# Patient Record
Sex: Male | Born: 2000 | Race: Black or African American | Hispanic: No | Marital: Single | Smoking: Never smoker
Health system: Southern US, Community
[De-identification: ages and names within clinical notes are randomized; demographics above are authoritative.]

## PROBLEM LIST (undated history)

## (undated) DIAGNOSIS — J45909 Unspecified asthma, uncomplicated: Secondary | ICD-10-CM

## (undated) DIAGNOSIS — Z87828 Personal history of other (healed) physical injury and trauma: Secondary | ICD-10-CM

## (undated) DIAGNOSIS — Z9109 Other allergy status, other than to drugs and biological substances: Secondary | ICD-10-CM

## (undated) HISTORY — PX: ADENOIDECTOMY: SUR15

---
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
## 2020-03-13 ENCOUNTER — Ambulatory Visit (INDEPENDENT_AMBULATORY_CARE_PROVIDER_SITE_OTHER): Payer: Medicaid Other

## 2020-03-13 ENCOUNTER — Encounter (HOSPITAL_COMMUNITY): Payer: Self-pay | Admitting: Emergency Medicine

## 2020-03-13 ENCOUNTER — Ambulatory Visit (HOSPITAL_COMMUNITY)
Admission: EM | Admit: 2020-03-13 | Discharge: 2020-03-13 | Disposition: A | Discharge status: Home / Self Care | Payer: Medicaid Other | Attending: Urgent Care | Admitting: Urgent Care

## 2020-03-13 DIAGNOSIS — R10819 Abdominal tenderness, unspecified site: Secondary | ICD-10-CM

## 2020-03-13 DIAGNOSIS — R079 Chest pain, unspecified: Secondary | ICD-10-CM | POA: Diagnosis not present

## 2020-03-13 DIAGNOSIS — R0789 Other chest pain: Secondary | ICD-10-CM

## 2020-03-13 MED ORDER — TIZANIDINE HCL 4 MG PO TABS: 4.0000 mg | ORAL_TABLET | Freq: Three times a day (TID) | ORAL | 0 refills | Status: DC | PRN

## 2020-03-13 MED ORDER — NAPROXEN 500 MG PO TABS: 500.0000 mg | ORAL_TABLET | Freq: Two times a day (BID) | ORAL | 0 refills | Status: DC

## 2020-03-13 NOTE — Discharge Instructions (Addendum)
I will call with your report tomorrow. In the meantime, start naproxen for pain and inflammation, tizanidine for muscle relaxant. Take it easy for the next 1-2 weeks with sports and strenuous physical activities.
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
## 2020-04-29 ENCOUNTER — Emergency Department (HOSPITAL_COMMUNITY)
Admission: EM | Admit: 2020-04-29 | Discharge: 2020-04-30 | Disposition: A | Discharge status: Home / Self Care | Payer: Medicaid Other | Attending: Emergency Medicine | Admitting: Emergency Medicine

## 2020-04-29 ENCOUNTER — Encounter (HOSPITAL_COMMUNITY): Payer: Self-pay | Admitting: Emergency Medicine

## 2020-04-29 ENCOUNTER — Emergency Department (HOSPITAL_COMMUNITY): Payer: Medicaid Other

## 2020-04-29 ENCOUNTER — Other Ambulatory Visit: Payer: Self-pay

## 2020-04-29 DIAGNOSIS — J45909 Unspecified asthma, uncomplicated: Secondary | ICD-10-CM | POA: Insufficient documentation

## 2020-04-29 DIAGNOSIS — F419 Anxiety disorder, unspecified: Secondary | ICD-10-CM | POA: Insufficient documentation

## 2020-04-29 DIAGNOSIS — Z046 Encounter for general psychiatric examination, requested by authority: Secondary | ICD-10-CM | POA: Diagnosis present

## 2020-04-29 DIAGNOSIS — F332 Major depressive disorder, recurrent severe without psychotic features: Secondary | ICD-10-CM | POA: Insufficient documentation

## 2020-04-29 DIAGNOSIS — U071 COVID-19: Secondary | ICD-10-CM | POA: Diagnosis not present

## 2020-04-29 DIAGNOSIS — R45851 Suicidal ideations: Secondary | ICD-10-CM | POA: Insufficient documentation

## 2020-04-29 LAB — CBC WITH DIFFERENTIAL/PLATELET
Abs Immature Granulocytes: 0.01 10*3/uL (ref 0.00–0.07)
Basophils Absolute: 0 10*3/uL (ref 0.0–0.1)
Basophils Relative: 1 %
Eosinophils Absolute: 0.1 10*3/uL (ref 0.0–0.5)
Eosinophils Relative: 4 %
HCT: 46.5 % (ref 39.0–52.0)
Hemoglobin: 15.6 g/dL (ref 13.0–17.0)
Immature Granulocytes: 0 %
Lymphocytes Relative: 33 %
Lymphs Abs: 1.2 10*3/uL (ref 0.7–4.0)
MCH: 32.5 pg (ref 26.0–34.0)
MCHC: 33.5 g/dL (ref 30.0–36.0)
MCV: 96.9 fL (ref 80.0–100.0)
Monocytes Absolute: 0.4 10*3/uL (ref 0.1–1.0)
Monocytes Relative: 12 %
Neutro Abs: 1.8 10*3/uL (ref 1.7–7.7)
Neutrophils Relative %: 50 %
Platelets: 274 10*3/uL (ref 150–400)
RBC: 4.8 MIL/uL (ref 4.22–5.81)
RDW: 11.3 % — ABNORMAL LOW (ref 11.5–15.5)
WBC: 3.6 10*3/uL — ABNORMAL LOW (ref 4.0–10.5)
nRBC: 0 % (ref 0.0–0.2)

## 2020-04-29 LAB — BASIC METABOLIC PANEL
Anion gap: 11 (ref 5–15)
BUN: 12 mg/dL (ref 6–20)
CO2: 27 mmol/L (ref 22–32)
Calcium: 9.5 mg/dL (ref 8.9–10.3)
Chloride: 103 mmol/L (ref 98–111)
Creatinine, Ser: 0.98 mg/dL (ref 0.61–1.24)
GFR, Estimated: >60 mL/min (ref >60)
Glucose, Bld: 92 mg/dL (ref 70–99)
Potassium: 3.4 mmol/L — ABNORMAL LOW (ref 3.5–5.1)
Sodium: 141 mmol/L (ref 135–145)

## 2020-04-29 NOTE — ED Triage Notes (Signed)
Patient reports intermittent SOB worse when lying down , fatigue ,palpitations, panick attack and lightheaded onset last week . No cough or fever .

## 2020-04-30 LAB — HEPATIC FUNCTION PANEL
ALT: 22 U/L (ref 0–44)
AST: 19 U/L (ref 15–41)
Albumin: 4.1 g/dL (ref 3.5–5.0)
Alkaline Phosphatase: 44 U/L (ref 38–126)
Bilirubin, Direct: 0.2 mg/dL (ref 0.0–0.2)
Indirect Bilirubin: 1 mg/dL — ABNORMAL HIGH (ref 0.3–0.9)
Total Bilirubin: 1.2 mg/dL (ref 0.3–1.2)
Total Protein: 7.1 g/dL (ref 6.5–8.1)

## 2020-04-30 LAB — RESP PANEL BY RT-PCR (FLU A&B, COVID) ARPGX2
Influenza A by PCR: NEGATIVE
Influenza B by PCR: NEGATIVE
SARS Coronavirus 2 by RT PCR: POSITIVE — AB

## 2020-04-30 LAB — RAPID URINE DRUG SCREEN, HOSP PERFORMED
Amphetamines: NOT DETECTED
Barbiturates: NOT DETECTED
Benzodiazepines: NOT DETECTED
Cocaine: NOT DETECTED
Opiates: NOT DETECTED
Tetrahydrocannabinol: NOT DETECTED

## 2020-04-30 LAB — SALICYLATE LEVEL: Salicylate Lvl: <7 mg/dL — ABNORMAL LOW (ref 7.0–30.0)

## 2020-04-30 LAB — ETHANOL: Alcohol, Ethyl (B): <10 mg/dL (ref <10)

## 2020-04-30 LAB — ACETAMINOPHEN LEVEL: Acetaminophen (Tylenol), Serum: <10 ug/mL — ABNORMAL LOW (ref 10–30)

## 2020-04-30 MED ORDER — ALBUTEROL SULFATE HFA 108 (90 BASE) MCG/ACT IN AERS: 2.0000 | INHALATION_SPRAY | Freq: Once | RESPIRATORY_TRACT | Status: DC
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
## 2020-04-30 NOTE — ED Provider Notes (Addendum)
[facility]   CSN: [id number] Arrival date & time: 04/29/20  2149     History Chief Complaint  Patient presents with  . Suicidal    Marylou FlesherJeremiah J Shahin is a 20 y.o. male history of asthma otherwise healthy no daily medication use.  Patient presented last night for shortness of breath that he relates to his anxiety and depression.  He reports that he has been under increasing stress over the past few days he was kicked out of his mother's house has been staying with his uncle and reports that he has not had a lot to eat or drink because of lack of food there.  He also reports that his car broke down.  He reports that he has been having thoughts of harming himself but he does not have a clear plan he reports that he has been feeling depressed for quite a while and he would like to talk to someone about this.  He reports that these feelings got worse last night which led to his feeling of being short of breath which led him to come to this emergency department for evaluation, due to extended wait time in the lobby over 10 hours his symptoms of shortness of breath and anxiety have improved.  Denies recent illness, fever/chills, fall/injury, chest pain, cough/hemoptysis, abdominal pain, vomiting, diarrhea, extremity swelling/color change, homicidal ideations, visual/auditory hallucinations, drug/alcohol use, attempts and self-harm, toxic ingestion or any additional concerns.  HPI     Past Medical History:  Diagnosis Date  . Asthma   . Environmental allergies   . History of sprained ankle     There are no problems to display for this patient.   Past Surgical History:  Procedure Laterality Date  . ADENOIDECTOMY         No family history on file.  Social History   Tobacco Use  . Smoking status: Never Smoker  . Smokeless tobacco: Never Used  Substance Use Topics  . Alcohol use: No  . Drug use: No    Home Medications Prior  to Admission medications   Medication Sig Start Date End Date Taking? Authorizing Provider  acetaminophen (TYLENOL) 325 MG tablet Take 2 tablets (650 mg total) by mouth every 6 (six) hours as needed for mild pain or moderate pain. 11/30/17   Sherrilee GillesScoville, Brittany N, NP  amoxicillin (AMOXIL) 500 MG capsule Take 1 capsule (500 mg total) by mouth 3 (three) times daily. 08/05/14   Defelice, Para MarchJeanette, NP  HYDROcodone-acetaminophen (NORCO/VICODIN) 5-325 MG per tablet Take 1 tablet by mouth every 4 (four) hours as needed. 12/18/13   Niel HummerKuhner, Ross, MD  ibuprofen (ADVIL,MOTRIN) 600 MG tablet Take 1 tablet (600 mg total) by mouth every 6 (six) hours as needed for mild pain or moderate pain. 11/30/17   Sherrilee GillesScoville, Brittany N, NP  naproxen (NAPROSYN) 500 MG tablet Take 1 tablet (500 mg total) by mouth 2 (two) times daily with a meal. 03/13/20   Wallis BambergMani, Mario, PA-C  tiZANidine (ZANAFLEX) 4 MG tablet Take 1 tablet (4 mg total) by mouth every 8 (eight) hours as needed. 03/13/20   Wallis BambergMani, Mario, PA-C    Allergies    Patient has no known allergies.  Review of Systems   Review of Systems Ten systems are reviewed and are negative for acute change except as noted in the HPI  Physical Exam Updated Vital Signs BP 121/78   Pulse 69   Temp 98.2 F (36.8 C)   Resp 16  Ht 5\' 8"  (1.727 m)   Wt 64 kg   SpO2 100%   BMI 21.45 kg/m   Physical Exam Constitutional:      General: He is not in acute distress.    Appearance: Normal appearance. He is well-developed. He is not ill-appearing or diaphoretic.  HENT:     Head: Normocephalic and atraumatic.  Eyes:     General: Vision grossly intact. Gaze aligned appropriately.     Pupils: Pupils are equal, round, and reactive to light.  Neck:     Trachea: Trachea and phonation normal.  Cardiovascular:     Rate and Rhythm: Normal rate and regular rhythm.     Pulses: Normal pulses.     Heart sounds: Normal heart sounds.  Pulmonary:     Effort: Pulmonary effort is normal. No  respiratory distress.     Breath sounds: Normal breath sounds.  Abdominal:     General: There is no distension.     Palpations: Abdomen is soft.     Tenderness: There is no abdominal tenderness. There is no guarding or rebound.  Musculoskeletal:        General: Normal range of motion.     Cervical back: Normal range of motion.  Skin:    General: Skin is warm and dry.  Neurological:     Mental Status: He is alert.     GCS: GCS eye subscore is 4. GCS verbal subscore is 5. GCS motor subscore is 6.     Comments: Speech is clear and goal oriented, follows commands Major Cranial nerves without deficit, no facial droop Moves extremities without ataxia, coordination intact  Psychiatric:        Attention and Perception: He does not perceive auditory or visual hallucinations.        Mood and Affect: Mood is anxious.        Speech: Speech normal.        Behavior: Behavior normal. Behavior is cooperative.        Thought Content: Thought content includes suicidal ideation. Thought content does not include homicidal ideation. Thought content does not include suicidal plan.     ED Results / Procedures / Treatments   Labs (all labs ordered are listed, but only abnormal results are displayed) Labs Reviewed  RESP PANEL BY RT-PCR (FLU A&B, COVID) ARPGX2 - Abnormal; Notable for the following components:      Result Value   SARS Coronavirus 2 by RT PCR POSITIVE (*)    All other components within normal limits  CBC WITH DIFFERENTIAL/PLATELET - Abnormal; Notable for the following components:   WBC 3.6 (*)    RDW 11.3 (*)    All other components within normal limits  BASIC METABOLIC PANEL - Abnormal; Notable for the following components:   Potassium 3.4 (*)    All other components within normal limits  SALICYLATE LEVEL - Abnormal; Notable for the following components:   Salicylate Lvl <7.0 (*)    All other components within normal limits  ACETAMINOPHEN LEVEL - Abnormal; Notable for the following  components:   Acetaminophen (Tylenol), Serum <10 (*)    All other components within normal limits  HEPATIC FUNCTION PANEL - Abnormal; Notable for the following components:   Indirect Bilirubin 1.0 (*)    All other components within normal limits  ETHANOL  RAPID URINE DRUG SCREEN, HOSP PERFORMED    EKG EKG Interpretation  Date/Time:  Monday April 29 2020 22:27:03 EST Ventricular Rate:  104 PR Interval:  140 QRS Duration:  86 QT Interval:  340 QTC Calculation: 447 R Axis:   66 Text Interpretation: Sinus tachycardia Otherwise normal ECG Since last tracing rate faster Confirmed by Jacalyn Lefevre [phone]) on 04/30/2020 8:08:36 AM   Radiology DG Chest 2 View  Result Date: 04/29/2020 CLINICAL DATA:  Shortness of breath EXAM: CHEST - 2 VIEW COMPARISON:  None. FINDINGS: The heart size and mediastinal contours are within normal limits. Both lungs are clear. The visualized skeletal structures are unremarkable. IMPRESSION: No active cardiopulmonary disease. Electronically Signed   By: Deatra Robinson M.D.   On: 04/29/2020 23:03    Procedures Procedures   Medications Ordered in ED Medications  albuterol (VENTOLIN HFA) 108 (90 Base) MCG/ACT inhaler 2 puff (2 puffs Inhalation Not Given 04/30/20 0830)    ED Course  I have reviewed the triage vital signs and the nursing notes.  Pertinent labs & imaging results that were available during my care of the patient were reviewed by me and considered in my medical decision making (see chart for details).    MDM Rules/Calculators/A&P                         Additional history obtained from: 1. Nursing notes from this visit. 2. Review of electronic medical records. ----------- 20 year old male presented with anxiety and shortness of breath last night increasing life stressors and depression recently. He endorses having a panic attack which led to his initial ER presentation around midnight last night symptoms have since resolved. He denies any  chest pain and his vital signs are stable on room air. Basic labs were obtained in triage including CBC BMP chest x-ray and EKG. BMP shows no emergent electrolyte derangement, AKI or gap. CBC shows mild leukopenia of 3.6, no anemia. Chest x-ray shows no active cardiopulmonary disease and patient's EKG shows sinus tachycardia with rate of 104 otherwise normal. Suspect patient's symptoms were likely related to anxiety depression and his panic attack which he describes last night low suspicion for ACS, PE, pneumonia, dissection or other acute cardiopulmonary etiologies. Patient's initial tachycardia on arrival resolved without intervention. On my exam his primary concern is actually his depression and suicidal ideations. I I have ordered medical clearance labs and TTS evaluation for this patient, he is here voluntarily. - Medical clearance labs reviewed, ethanol is negative patient does not appear in withdrawal. Tylenol and salicylate levels are negative and he has no history of toxic ingestions. LFTs are nonacute. Patient's Covid test and UDS are pending.  At this time there does not appear to be any evidence of an acute emergency medical condition and the patient appears stable for psychiatric evaluation/disposition ========================== Addendum: Informed by behavioral health counselor Melynda Ripple that patient has been psychiatrically cleared by Marciano Sequin, NP.  They have added patient's after visit summary and given him behavioral health resources.  As patient is now medically and psychiatrically cleared he will be discharged.  Incidentally patient's Covid test returned positive he is asymptomatic and unvaccinated.  I have referred patient to the ambulatory COVID treatment center.  He is made aware of his COVID positive test and to isolate/quarantine and monitor his symptoms closely.  On reassessment patient is resting comfortably sitting up on the edge of bed eating a sandwich no acute distress, vital  signs stable.  At this time there does not appear to be any evidence of an acute emergency medical condition and the patient appears stable for discharge with appropriate outpatient follow up. Diagnosis was discussed  with patient who verbalizes understanding of care plan and is agreeable to discharge. I have discussed return precautions with patient who verbalizes understanding. Patient encouraged to follow-up with their PCP and Behavioral health. All questions answered.  Patient's case discussed with Dr. Particia Nearing who agrees with plan to discharge with follow-up.   Note: Portions of this report may have been transcribed using voice recognition software. Every effort was made to ensure accuracy; however, inadvertent computerized transcription errors may still be present. Final Clinical Impression(s) / ED Diagnoses Final diagnoses:  Suicidal ideation  Lab test positive for detection of COVID-19 virus    Rx / DC Orders ED Discharge Orders         Ordered    Ambulatory referral for Covid Treatment        04/30/20 1323           Elizabeth Palau 04/30/20 1254    Jacalyn Lefevre, MD 04/30/20 1303    Bill Salinas, PA-C 04/30/20 1401    Jacalyn Lefevre, MD 04/30/20 1515

## 2020-04-30 NOTE — Discharge Instructions (Addendum)
At this time there does not appear to be the presence of an emergent medical condition, however there is always the potential for conditions to change. Please read and follow the below instructions.  Please return to the Emergency Department immediately for any new or worsening symptoms. Please be sure to follow up with your Primary Care Provider within one week regarding your visit today; please call their office to schedule an appointment even if you are feeling better for a follow-up visit. Your Covid test today was positive please quarantine for the next 10 days to avoid transmission of this virus to others.  You have been referred to the COVID treatment clinic, they should call you in the next day or two to discuss possible options if you qualify. Please follow-up with the behavioral health specialist recommendations.  Go to the nearest Emergency Department immediately if: You have fever or chills You have chest pain or difficulty breathing You have belly pain, nausea or vomit You are talking about suicide or wishing to die. You start making plans for how to commit suicide. You feel that you have no reason to live. You start making plans for putting your affairs in order, saying goodbye, or giving your possessions away. You feel guilt, shame, or unbearable pain, and it seems like there is no way out. You are frequently using drugs or alcohol. You are engaging in risky behaviors that could lead to death. You have any new/concerning or worsening of symptoms   Please read the additional information packets attached to your discharge summary.  Do not take your medicine if  develop an itchy rash, swelling in your mouth or lips, or difficulty breathing; call 911 and seek immediate emergency medical attention if this occurs.  You may review your lab tests and imaging results in their entirety on your MyChart account.  Please discuss all results of fully with your primary care provider and  other specialist at your follow-up visit.  Note: Portions of this text may have been transcribed using voice recognition software. Every effort was made to ensure accuracy; however, inadvertent computerized transcription errors may still be present.

## 2020-04-30 NOTE — BH Assessment (Signed)
Patient is psych cleared, per Marciano Sequin, NP. Patient recommended to follow up with IOP at the [facility] Disposition Social Worker to assist with entering the recommendations for IOP in his AVS. TTS provided patient's nurse and EDP with updates regarding his disposition.

## 2020-05-01 ENCOUNTER — Other Ambulatory Visit (HOSPITAL_COMMUNITY): Payer: Self-pay | Admitting: Family

## 2020-05-01 ENCOUNTER — Ambulatory Visit (HOSPITAL_COMMUNITY)
Admission: RE | Admit: 2020-05-01 | Discharge: 2020-05-01 | Disposition: A | Discharge status: Home / Self Care | Payer: Medicaid Other | Source: Ambulatory Visit | Attending: Pulmonary Disease | Admitting: Pulmonary Disease

## 2020-05-01 DIAGNOSIS — J989 Respiratory disorder, unspecified: Secondary | ICD-10-CM | POA: Insufficient documentation

## 2020-05-01 DIAGNOSIS — U071 COVID-19: Secondary | ICD-10-CM | POA: Insufficient documentation

## 2020-05-01 MED ORDER — DIPHENHYDRAMINE HCL 50 MG/ML IJ SOLN: 50.0000 mg | Freq: Once | INTRAMUSCULAR | Status: DC | PRN

## 2020-05-01 MED ORDER — EPINEPHRINE 0.3 MG/0.3ML IJ SOAJ: 0.3000 mg | Freq: Once | INTRAMUSCULAR | Status: DC | PRN

## 2020-05-01 MED ORDER — SOTROVIMAB 500 MG/8ML IV SOLN
500.0000 mg | Freq: Once | INTRAVENOUS | Status: AC
  Administered 2020-05-01: 500 mg via INTRAVENOUS

## 2020-05-01 MED ORDER — ALBUTEROL SULFATE HFA 108 (90 BASE) MCG/ACT IN AERS: 2.0000 | INHALATION_SPRAY | Freq: Once | RESPIRATORY_TRACT | Status: DC | PRN

## 2020-05-01 MED ORDER — METHYLPREDNISOLONE SODIUM SUCC 125 MG IJ SOLR: 125.0000 mg | Freq: Once | INTRAMUSCULAR | Status: DC | PRN

## 2020-05-01 MED ORDER — SODIUM CHLORIDE 0.9 % IV SOLN: INTRAVENOUS | Status: DC | PRN

## 2020-05-01 MED ORDER — FAMOTIDINE IN NACL 20-0.9 MG/50ML-% IV SOLN: 20.0000 mg | Freq: Once | INTRAVENOUS | Status: DC | PRN

## 2020-05-01 NOTE — Progress Notes (Signed)
Patient reviewed Fact Sheet for Patients, Parents, and Caregivers for Emergency Use Authorization (EUA) of sotrovimab for the Treatment of Coronavirus. Patient also reviewed and is agreeable to the estimated cost of treatment. Patient is agreeable to proceed.   

## 2020-05-01 NOTE — Discharge Summary (Signed)
Diagnosis: COVID-19  Physician: Dr. Patrick Wright  Procedure: Covid Infusion Clinic Med: Sotrovimab infusion - Provided patient with sotrovimab fact sheet for patients, parents, and caregivers prior to infusion.   Complications: No immediate complications noted  Discharge: Discharged home    

## 2020-05-01 NOTE — Progress Notes (Signed)
I connected by phone with David Andrade on 05/01/2020 at 10:20 AM to discuss the potential use of a new treatment for mild to moderate COVID-19 viral infection in non-hospitalized patients.  This patient is a 20 y.o. male that meets the FDA criteria for Emergency Use Authorization of COVID monoclonal antibody sotrovimab.  Has a (+) direct SARS-CoV-2 viral test result  Has mild or moderate COVID-19   Is NOT hospitalized due to COVID-19  Is within 10 days of symptom onset  Has at least one of the high risk factor(s) for progression to severe COVID-19 and/or hospitalization as defined in EUA.  Specific high risk criteria : Chronic Lung Disease   Symptoms of chest tightness and SOB began 04/30/2020.   I have spoken and communicated the following to the patient or parent/caregiver regarding COVID monoclonal antibody treatment:  1. FDA has authorized the emergency use for the treatment of mild to moderate COVID-19 in adults and pediatric patients with positive results of direct SARS-CoV-2 viral testing who are 69 years of age and older weighing at least 40 kg, and who are at high risk for progressing to severe COVID-19 and/or hospitalization.  2. The significant known and potential risks and benefits of COVID monoclonal antibody, and the extent to which such potential risks and benefits are unknown.  3. Information on available alternative treatments and the risks and benefits of those alternatives, including clinical trials.  4. Patients treated with COVID monoclonal antibody should continue to self-isolate and use infection control measures (e.g., wear mask, isolate, social distance, avoid sharing personal items, clean and disinfect "high touch" surfaces, and frequent handwashing) according to CDC guidelines.   5. The patient or parent/caregiver has the option to accept or refuse COVID monoclonal antibody treatment.  After reviewing this information with the patient, the patient has  agreed to receive one of the available covid 19 monoclonal antibodies and will be provided an appropriate fact sheet prior to infusion. Morton Stall, NP 05/01/2020 10:20 AM

## 2020-05-01 NOTE — Discharge Instructions (Signed)

## 2020-07-20 ENCOUNTER — Encounter (HOSPITAL_COMMUNITY): Payer: Self-pay | Admitting: Emergency Medicine

## 2020-07-20 ENCOUNTER — Emergency Department (HOSPITAL_COMMUNITY)
Admission: EM | Admit: 2020-07-20 | Discharge: 2020-07-21 | Disposition: A | Discharge status: Home / Self Care | Payer: Medicaid Other | Attending: Emergency Medicine | Admitting: Emergency Medicine

## 2020-07-20 ENCOUNTER — Other Ambulatory Visit: Payer: Self-pay

## 2020-07-20 ENCOUNTER — Ambulatory Visit (INDEPENDENT_AMBULATORY_CARE_PROVIDER_SITE_OTHER): Payer: Medicaid Other

## 2020-07-20 ENCOUNTER — Ambulatory Visit (HOSPITAL_COMMUNITY)
Admission: EM | Admit: 2020-07-20 | Discharge: 2020-07-20 | Disposition: A | Discharge status: Home / Self Care | Payer: Medicaid Other | Attending: Physician Assistant | Admitting: Physician Assistant

## 2020-07-20 DIAGNOSIS — R42 Dizziness and giddiness: Secondary | ICD-10-CM | POA: Diagnosis not present

## 2020-07-20 DIAGNOSIS — R0789 Other chest pain: Secondary | ICD-10-CM | POA: Insufficient documentation

## 2020-07-20 DIAGNOSIS — E162 Hypoglycemia, unspecified: Secondary | ICD-10-CM

## 2020-07-20 DIAGNOSIS — R9431 Abnormal electrocardiogram [ECG] [EKG]: Secondary | ICD-10-CM | POA: Diagnosis not present

## 2020-07-20 DIAGNOSIS — J45909 Unspecified asthma, uncomplicated: Secondary | ICD-10-CM | POA: Insufficient documentation

## 2020-07-20 DIAGNOSIS — R0602 Shortness of breath: Secondary | ICD-10-CM

## 2020-07-20 DIAGNOSIS — F419 Anxiety disorder, unspecified: Secondary | ICD-10-CM | POA: Insufficient documentation

## 2020-07-20 LAB — BASIC METABOLIC PANEL
Anion gap: 7 (ref 5–15)
BUN: 8 mg/dL (ref 6–20)
CO2: 27 mmol/L (ref 22–32)
Calcium: 9.5 mg/dL (ref 8.9–10.3)
Chloride: 102 mmol/L (ref 98–111)
Creatinine, Ser: 0.95 mg/dL (ref 0.61–1.24)
GFR, Estimated: >60 mL/min (ref >60)
Glucose, Bld: 91 mg/dL (ref 70–99)
Potassium: 3.5 mmol/L (ref 3.5–5.1)
Sodium: 136 mmol/L (ref 135–145)

## 2020-07-20 LAB — CBC WITH DIFFERENTIAL/PLATELET
Abs Immature Granulocytes: 0.01 10*3/uL (ref 0.00–0.07)
Basophils Absolute: 0 10*3/uL (ref 0.0–0.1)
Basophils Relative: 1 %
Eosinophils Absolute: 0.1 10*3/uL (ref 0.0–0.5)
Eosinophils Relative: 3 %
HCT: 46.4 % (ref 39.0–52.0)
Hemoglobin: 15 g/dL (ref 13.0–17.0)
Immature Granulocytes: 0 %
Lymphocytes Relative: 34 %
Lymphs Abs: 1.2 10*3/uL (ref 0.7–4.0)
MCH: 32 pg (ref 26.0–34.0)
MCHC: 32.3 g/dL (ref 30.0–36.0)
MCV: 98.9 fL (ref 80.0–100.0)
Monocytes Absolute: 0.3 10*3/uL (ref 0.1–1.0)
Monocytes Relative: 9 %
Neutro Abs: 1.9 10*3/uL (ref 1.7–7.7)
Neutrophils Relative %: 53 %
Platelets: 329 10*3/uL (ref 150–400)
RBC: 4.69 MIL/uL (ref 4.22–5.81)
RDW: 11.2 % — ABNORMAL LOW (ref 11.5–15.5)
WBC: 3.6 10*3/uL — ABNORMAL LOW (ref 4.0–10.5)
nRBC: 0 % (ref 0.0–0.2)

## 2020-07-20 LAB — CBG MONITORING, ED
Glucose-Capillary: 66 mg/dL — ABNORMAL LOW (ref 70–99)
Glucose-Capillary: 89 mg/dL (ref 70–99)
Glucose-Capillary: 66 mg/dL — ABNORMAL LOW (ref 70–99)

## 2020-07-20 LAB — TROPONIN I (HIGH SENSITIVITY)
Troponin I (High Sensitivity): 5 ng/L (ref <18)
Troponin I (High Sensitivity): 5 ng/L (ref <18)

## 2020-07-20 LAB — MAGNESIUM: Magnesium: 1.9 mg/dL (ref 1.7–2.4)

## 2020-07-20 MED ORDER — ASPIRIN 81 MG PO CHEW
324.0000 mg | CHEWABLE_TABLET | Freq: Once | ORAL | Status: AC
  Administered 2020-07-20: 324 mg via ORAL
  Filled 2020-07-20: qty 4

## 2020-07-20 MED ORDER — GLUCOSE 4 G PO CHEW
3.0000 | CHEWABLE_TABLET | Freq: Once | ORAL | Status: AC
  Administered 2020-07-20: 12 g via ORAL

## 2020-07-20 MED ORDER — GLUCOSE 4 G PO CHEW
CHEWABLE_TABLET | ORAL | Status: AC
  Filled 2020-07-20: qty 3

## 2020-07-20 MED ORDER — GLUCOSE 4 G PO CHEW: 3.0000 | CHEWABLE_TABLET | Freq: Once | ORAL | Status: DC

## 2020-07-20 MED ORDER — IBUPROFEN 400 MG PO TABS: 400.0000 mg | ORAL_TABLET | Freq: Three times a day (TID) | ORAL | 0 refills | Status: AC

## 2020-07-20 NOTE — ED Provider Notes (Signed)
[facility]    CSN: [id number] Arrival date & time: 07/20/20  1605      History   Chief Complaint Chief Complaint  Patient presents with  . Dizziness  . Shortness of Breath    HPI David Andrade is a 20 y.o. male.   Patient presents today with a several day history of episodic lightheadedness.  He reports this is triggered by trying to take deep breaths and other activities.  He does have a history of asthma but has not been using albuterol inhaler as he has not required this in many years.  He denies any recent illness or additional symptoms including cough, congestion, chest pain.  He did have COVID-19 approximately 3 months ago but reports symptoms resolved following illness.  He denies any personal or family history of arrhythmia.  Denies syncopal episodes.  Denies any recent medication changes or illicit drug use.  Denies head injury.  He has not tried any over-the-counter medications for symptom management.  Reports eating and drinking normally.     Past Medical History:  Diagnosis Date  . Asthma   . Environmental allergies   . History of sprained ankle     There are no problems to display for this patient.   Past Surgical History:  Procedure Laterality Date  . ADENOIDECTOMY         Home Medications    Prior to Admission medications   Medication Sig Start Date End Date Taking? Authorizing Provider  acetaminophen (TYLENOL) 325 MG tablet Take 2 tablets (650 mg total) by mouth every 6 (six) hours as needed for mild pain or moderate pain. 11/30/17   Sherrilee Gilles, NP  ibuprofen (ADVIL,MOTRIN) 600 MG tablet Take 1 tablet (600 mg total) by mouth every 6 (six) hours as needed for mild pain or moderate pain. 11/30/17   Sherrilee Gilles, NP    Family History Family History  Problem Relation Age of Onset  . Healthy Mother   . Healthy Father     Social History Social History   Tobacco Use  . Smoking status: Never Smoker  . Smokeless  tobacco: Never Used  Substance Use Topics  . Alcohol use: No  . Drug use: No     Allergies   Patient has no known allergies.   Review of Systems Review of Systems  Constitutional: Positive for activity change. Negative for appetite change, fatigue and fever.  HENT: Negative for congestion, sinus pressure, sneezing and sore throat.   Respiratory: Positive for chest tightness and shortness of breath. Negative for cough and wheezing.   Cardiovascular: Negative for chest pain and palpitations.  Gastrointestinal: Negative for abdominal pain, diarrhea, nausea and vomiting.  Neurological: Positive for light-headedness. Negative for dizziness, syncope, weakness and headaches.     Physical Exam Triage Vital Signs ED Triage Vitals  Enc Vitals Group     BP 07/20/20 1621 124/81     Pulse Rate 07/20/20 1621 62     Resp 07/20/20 1621 16     Temp 07/20/20 1621 98.1 F (36.7 C)     Temp Source 07/20/20 1621 Oral     SpO2 07/20/20 1621 100 %     Weight --      Height --      Head Circumference --      Peak Flow --      Pain Score 07/20/20 1620 0     Pain Loc --      Pain Edu? --  Excl. in GC? --    No data found.  Updated Vital Signs BP 124/81   Pulse 62   Temp 98.1 F (36.7 C) (Oral)   Resp 16   SpO2 100%   Visual Acuity Right Eye Distance:   Left Eye Distance:   Bilateral Distance:    Right Eye Near:   Left Eye Near:    Bilateral Near:     Physical Exam Vitals reviewed.  Constitutional:      General: He is awake.     Appearance: Normal appearance. He is normal weight. He is not ill-appearing.     Comments: Very pleasant male appears stated age in no acute distress  HENT:     Head: Normocephalic and atraumatic.     Right Ear: Tympanic membrane, ear canal and external ear normal. Tympanic membrane is not erythematous or bulging.     Left Ear: Tympanic membrane, ear canal and external ear normal. Tympanic membrane is not erythematous or bulging.     Nose: Nose  normal.     Mouth/Throat:     Tongue: Tongue does not deviate from midline.     Pharynx: Uvula midline. No oropharyngeal exudate or posterior oropharyngeal erythema.  Eyes:     Extraocular Movements: Extraocular movements intact.     Pupils: Pupils are equal, round, and reactive to light.  Cardiovascular:     Rate and Rhythm: Normal rate and regular rhythm.     Heart sounds: No murmur heard.   Pulmonary:     Effort: Pulmonary effort is normal. No accessory muscle usage or respiratory distress.     Breath sounds: Normal breath sounds. No stridor. No wheezing, rhonchi or rales.     Comments: Clear to auscultation bilaterally Abdominal:     General: Bowel sounds are normal.     Palpations: Abdomen is soft.     Tenderness: There is no abdominal tenderness.  Musculoskeletal:     Comments: Strength 5/5 bilateral upper and lower extremities  Lymphadenopathy:     Head:     Right side of head: No submental, submandibular or tonsillar adenopathy.     Left side of head: No submental, submandibular or tonsillar adenopathy.     Cervical: No cervical adenopathy.  Neurological:     General: No focal deficit present.     Mental Status: He is alert.     Motor: Motor function is intact.     Comments: Cranial nerves II through XII intact.  No focal neurological defect on exam.  Psychiatric:        Behavior: Behavior is cooperative.      UC Treatments / Results  Labs (all labs ordered are listed, but only abnormal results are displayed) Labs Reviewed  CBG MONITORING, ED - Abnormal; Notable for the following components:      Result Value   Glucose-Capillary 66 (*)    All other components within normal limits    EKG   Radiology DG Chest 2 View  Result Date: 07/20/2020 CLINICAL DATA:  Shortness of breath and lightheadedness. EXAM: CHEST - 2 VIEW COMPARISON:  Chest radiograph dated 04/29/2020. FINDINGS: The heart size and mediastinal contours are within normal limits. Both lungs are  clear. The visualized skeletal structures are unremarkable. IMPRESSION: No active cardiopulmonary disease. Electronically Signed   By: Romona Curls M.D.   On: 07/20/2020 17:14    Procedures Procedures (including critical care time)  Medications Ordered in UC Medications  glucose chewable tablet 12 g (12 g Oral Not Given  07/20/20 1723)  glucose chewable tablet 12 g (12 g Oral Given 07/20/20 1723)    Initial Impression / Assessment and Plan / UC Course  I have reviewed the triage vital signs and the nursing notes.  Pertinent labs & imaging results that were available during my care of the patient were reviewed by me and considered in my medical decision making (see chart for details).     EKG obtained showed ST elevation in V1 through V6 so patient was transported by EMS to emergency room.  Code STEMI was not activated as this is likely repolarization, however, compared to 04/30/2020 tracing ST elevation is more prominent.  Patient was noted to be hyperglycemic with glucose of 66 and given glucose tablets but glucose level was not rechecked prior to EMS arriving.  IV started.  Patient transported to hospital by EMS and was stable at the time of discharge.  Final Clinical Impressions(s) / UC Diagnoses   Final diagnoses:  Episodic lightheadedness  Shortness of breath  Abnormal EKG  Hypoglycemia   Discharge Instructions   None    ED Prescriptions    None     PDMP not reviewed this encounter.   Jeani Hawking, PA-C 07/20/20 1733

## 2020-07-20 NOTE — ED Notes (Signed)
Pt anxious  No chest pain at present

## 2020-07-20 NOTE — ED Notes (Signed)
Attempted to call ED Charge RN no answer.

## 2020-07-20 NOTE — ED Triage Notes (Signed)
Pt arrives via EMS from urgent care with complaints of abnormal EKG.Pt states he has been dizzy X1 day. Denies chest pain at this time.

## 2020-07-20 NOTE — ED Triage Notes (Signed)
PT reported to this writer that one of his coworkers saw a bag of cocaine under his ticket book . Pt works at [employer]. Pt reports he did not touch the bag.

## 2020-07-20 NOTE — ED Triage Notes (Signed)
Pt presents today with c/o of lightheadedness when he inhales x 3 days. Denies chest pain. Denies n/v/d

## 2020-07-20 NOTE — Discharge Instructions (Addendum)
As discussed, today's evaluation has been reassuring.  However, it is important that you monitor your condition carefully and do not hesitate to return here for any concerning changes.  Otherwise, please take ibuprofen, 400 mg, 3 times daily with food for the next 3 days. Return here for concerning changes in your condition.

## 2020-07-20 NOTE — ED Provider Notes (Signed)
[facility] EMERGENCY DEPARTMENT Provider Note   CSN: [id number] Arrival date & time: 07/20/20  1746     History No chief complaint on file.   David Andrade is a 20 y.o. male.  HPI 20 year old male with no pertinent medical history presents to the emergency department for abnormal EKG.  States he was going to urgent care today for 1 day of lightheadedness after he drank a lot of vodka.  Lightheadedness is worse with inspiration.  He denies history of lightheadedness like this previously.  It is intermittent with waxing and waning intensity.  Has not taken anything for the symptoms, but thought that it was resolving.  Does endorse marijuana use, denies other drug abuse.  When he went to the urgent care, they said that he might have a heart attack on his EKG and he was sent here for further evaluation.  Did have unremarkable x-ray at urgent care.  Denies chest pain or shortness of breath.  Does state he was told he had an enlarged heart as a child, but otherwise denies history of heart disease.  Denies family history of heart disease or early death.    Past Medical History:  Diagnosis Date  . Asthma   . Environmental allergies   . History of sprained ankle     There are no problems to display for this patient.   Past Surgical History:  Procedure Laterality Date  . ADENOIDECTOMY         Family History  Problem Relation Age of Onset  . Healthy Mother   . Healthy Father     Social History   Tobacco Use  . Smoking status: Never Smoker  . Smokeless tobacco: Never Used  Substance Use Topics  . Alcohol use: No  . Drug use: No    Home Medications Prior to Admission medications   Medication Sig Start Date End Date Taking? Authorizing Provider  ibuprofen (ADVIL) 400 MG tablet Take 1 tablet (400 mg total) by mouth 3 (three) times daily for 5 days. Take one tablet three times daily for three days 07/20/20 07/25/20 Yes Gerhard Munch, MD  acetaminophen  (TYLENOL) 325 MG tablet Take 2 tablets (650 mg total) by mouth every 6 (six) hours as needed for mild pain or moderate pain. Patient not taking: Reported on 07/20/2020 11/30/17   Sherrilee Gilles, NP  ibuprofen (ADVIL,MOTRIN) 600 MG tablet Take 1 tablet (600 mg total) by mouth every 6 (six) hours as needed for mild pain or moderate pain. Patient not taking: Reported on 07/20/2020 11/30/17   Sherrilee Gilles, NP    Allergies    Patient has no known allergies.  Review of Systems   Review of Systems  Constitutional: Negative for chills and fever.  HENT: Negative for ear pain and sore throat.   Eyes: Negative for pain and visual disturbance.  Respiratory: Negative for cough and shortness of breath.   Cardiovascular: Negative for chest pain and palpitations.  Gastrointestinal: Negative for abdominal pain and vomiting.  Genitourinary: Negative for dysuria and hematuria.  Musculoskeletal: Negative for arthralgias and back pain.  Skin: Negative for color change and rash.  Neurological: Positive for light-headedness. Negative for seizures and syncope.  All other systems reviewed and are negative.   Physical Exam Updated Vital Signs BP (!) 121/93   Pulse 68   Temp 98.6 F (37 C) (Oral)   Resp 13   SpO2 100%   Physical Exam Vitals and nursing note reviewed.  Constitutional:  General: He is not in acute distress.    Appearance: Normal appearance. He is well-developed. He is not ill-appearing.  HENT:     Head: Normocephalic and atraumatic.     Right Ear: External ear normal.     Left Ear: External ear normal.     Mouth/Throat:     Mouth: Mucous membranes are moist.  Eyes:     Extraocular Movements: Extraocular movements intact.     Conjunctiva/sclera: Conjunctivae normal.  Cardiovascular:     Rate and Rhythm: Normal rate and regular rhythm.     Pulses: Normal pulses.     Heart sounds: Normal heart sounds. No murmur heard.   Pulmonary:     Effort: Pulmonary effort is  normal. No respiratory distress.     Breath sounds: Normal breath sounds.  Abdominal:     General: Abdomen is flat.     Palpations: Abdomen is soft.     Tenderness: There is no abdominal tenderness. There is no guarding or rebound.  Musculoskeletal:        General: Normal range of motion.     Cervical back: Neck supple.     Right lower leg: No edema.     Left lower leg: No edema.  Skin:    General: Skin is warm and dry.     Capillary Refill: Capillary refill takes less than 2 seconds.  Neurological:     General: No focal deficit present.     Mental Status: He is alert and oriented to person, place, and time.  Psychiatric:        Behavior: Behavior normal.     Comments: Reports anxiety about being told he might be having a heart attack, but is very pleasant and interactive     ED Results / Procedures / Treatments   Labs (all labs ordered are listed, but only abnormal results are displayed) Labs Reviewed  CBC WITH DIFFERENTIAL/PLATELET - Abnormal; Notable for the following components:      Result Value   WBC 3.6 (*)    RDW 11.2 (*)    All other components within normal limits  CBG MONITORING, ED - Abnormal; Notable for the following components:   Glucose-Capillary 66 (*)    All other components within normal limits  BASIC METABOLIC PANEL  MAGNESIUM  CBG MONITORING, ED  TROPONIN I (HIGH SENSITIVITY)  TROPONIN I (HIGH SENSITIVITY)    EKG EKG Interpretation  Date/Time:  Saturday Jul 20 2020 18:02:26 EDT Ventricular Rate:  70 PR Interval:  152 QRS Duration: 87 QT Interval:  366 QTC Calculation: 395 R Axis:   56 Text Interpretation: Sinus rhythm LVH by voltage ST-t wave abnormality Abnormal ECG Confirmed by Gerhard Munch [phone]) on 07/20/2020 6:05:17 PM   Radiology DG Chest 2 View  Result Date: 07/20/2020 CLINICAL DATA:  Shortness of breath and lightheadedness. EXAM: CHEST - 2 VIEW COMPARISON:  Chest radiograph dated 04/29/2020. FINDINGS: The heart size and  mediastinal contours are within normal limits. Both lungs are clear. The visualized skeletal structures are unremarkable. IMPRESSION: No active cardiopulmonary disease. Electronically Signed   By: Romona Curls M.D.   On: 07/20/2020 17:14    Procedures Procedures   Medications Ordered in ED Medications  aspirin chewable tablet 324 mg (324 mg Oral Given 07/20/20 1841)    ED Course  I have reviewed the triage vital signs and the nursing notes.  Pertinent labs & imaging results that were available during my care of the patient were reviewed by me and considered in  my medical decision making (see chart for details).    MDM Rules/Calculators/A&P                          20 year old male with no pertinent medical history presenting to the emergency department for abnormal EKG in the setting of lightheadedness.  Vital signs are stable, patient is not in acute distress.  Reassuring exam with normal cardiovascular exam.  Equal pulses bilaterally.  Abdomen is soft nontender.  No peripheral edema.  Lungs clear to auscultation bilaterally.  Low suspicion for ACS in a 20 year old with no previous risk factors, but will evaluate with troponin and basic labs.  Review of chest x-ray from outside facility shows no acute cardiac or pulmonary abnormality.  Uncertain previous comment about enlarged heart, but it certainly looks normal size on today's x-ray.  Low suspicion for PE as vital signs are normal, patient is not short of breath, and is PERC negative with low risk Wells.  Suspect lightheadedness is secondary to possible slight dehydration after binge drinking.  He also reports smoking marijuana, this could be playing into symptoms as well.  He is currently asymptomatic here in the emergency department.  EKG here in the emergency department shows normal sinus rhythm.  He does have lateral ST changes that are nonspecific.  No concomitant depressions that meet criteria.  Inpatient 20 years old, more likely  to represent repolarization abnormality, but again will evaluate with troponins.  Labs unremarkable.  Troponin negative x2.  Patient still not having chest pain.  Patient stable for discharge.  We discussed symptomatic management and return precautions.  Recommended close PCP follow-up.  Final Clinical Impression(s) / ED Diagnoses Final diagnoses:  Atypical chest pain    Rx / DC Orders ED Discharge Orders         Ordered    ibuprofen (ADVIL) 400 MG tablet  3 times daily        07/20/20 2229           Louretta Parma, DO 07/20/20 2311    Gerhard Munch, MD 07/20/20 [phone]

## 2020-07-20 NOTE — ED Triage Notes (Signed)
Patient is being discharged from the Urgent Care and sent to the Emergency Department via EMS . Per Denny Peon Raspet-PA, patient is in need of higher level of care due to abnormal EKG. Patient is aware and verbalizes understanding of plan of care.  Vitals:   07/20/20 1621  BP: 124/81  Pulse: 62  Resp: 16  Temp: 98.1 F (36.7 C)  SpO2: 100%

## 2020-11-12 ENCOUNTER — Other Ambulatory Visit: Payer: Self-pay

## 2020-11-12 ENCOUNTER — Encounter (HOSPITAL_COMMUNITY): Payer: Self-pay | Admitting: *Deleted

## 2020-11-12 ENCOUNTER — Ambulatory Visit (HOSPITAL_COMMUNITY)
Admission: EM | Admit: 2020-11-12 | Discharge: 2020-11-12 | Disposition: A | Discharge status: Home / Self Care | Payer: Medicaid Other | Attending: Internal Medicine | Admitting: Internal Medicine

## 2020-11-12 DIAGNOSIS — J020 Streptococcal pharyngitis: Secondary | ICD-10-CM | POA: Diagnosis not present

## 2020-11-12 LAB — POCT RAPID STREP A, ED / UC: Streptococcus, Group A Screen (Direct): POSITIVE — AB

## 2020-11-12 MED ORDER — AMOXICILLIN 500 MG PO CAPS: 500.0000 mg | ORAL_CAPSULE | Freq: Two times a day (BID) | ORAL | 0 refills | Status: AC

## 2020-11-12 NOTE — ED Triage Notes (Signed)
Pt  brother tested positive for strep . Pt has a sore throat.

## 2020-11-12 NOTE — ED Provider Notes (Signed)
[facility]   [id number] 11/12/20 Arrival Time: 0859  [id number] THROAT  SUBJECTIVE: History from: patient.  David Andrade is a 20 y.o. male who presents with abrupt onset of sore throat for the past several days.  Reports family member recently tested positive for strep throat.  Symptoms are made worse with swallowing, but tolerating liquids and own secretions without difficulty.    Denies fever, chills, fatigue, ear pain, sinus pain, rhinorrhea, nasal congestion, cough, SOB, wheezing, chest pain, nausea, rash, changes in bowel or bladder habits.     ROS: As per HPI.  All other pertinent ROS negative.     Past Medical History:  Diagnosis Date   Asthma    Environmental allergies    History of sprained ankle    Past Surgical History:  Procedure Laterality Date   ADENOIDECTOMY     No Known Allergies No current facility-administered medications on file prior to encounter.   Current Outpatient Medications on File Prior to Encounter  Medication Sig Dispense Refill   acetaminophen (TYLENOL) 325 MG tablet Take 2 tablets (650 mg total) by mouth every 6 (six) hours as needed for mild pain or moderate pain. (Patient not taking: Reported on 07/20/2020) 30 tablet 0   ibuprofen (ADVIL,MOTRIN) 600 MG tablet Take 1 tablet (600 mg total) by mouth every 6 (six) hours as needed for mild pain or moderate pain. (Patient not taking: Reported on 07/20/2020) 30 tablet 0   Social History   Socioeconomic History   Marital status: Single    Spouse name: Not on file   Number of children: Not on file   Years of education: Not on file   Highest education level: Not on file  Occupational History   Not on file  Tobacco Use   Smoking status: Never   Smokeless tobacco: Never  Substance and Sexual Activity   Alcohol use: No   Drug use: No   Sexual activity: Not on file  Other Topics Concern   Not on file  Social History Narrative   Not on file   Social Determinants of Health    Financial Resource Strain: Not on file  Food Insecurity: Not on file  Transportation Needs: Not on file  Physical Activity: Not on file  Stress: Not on file  Social Connections: Not on file  Intimate Partner Violence: Not on file   Family History  Problem Relation Age of Onset   Healthy Mother    Healthy Father     OBJECTIVE:  Vitals:   11/12/20 0953  BP: 110/65  Pulse: 65  Resp: 16  Temp: 98.5 F (36.9 C)  SpO2: 98%     General appearance: alert; appears fatigued, but nontoxic, speaking in full sentences and managing own secretions HEENT: NCAT; Ears: EACs clear, TMs pearly gray with visible cone of light, without erythema; Eyes: PERRL, EOMI grossly; Nose: no obvious rhinorrhea; Throat: oropharynx clear, tonsils 3+ and mildly erythematous with white tonsillar exudates, uvula midline Neck: supple  Lungs: CTA bilaterally without adventitious breath sounds; cough absent Heart: regular rate and rhythm.  Radial pulses 2+ symmetrical bilaterally Skin: warm and dry Psychological: alert and cooperative; normal mood and affect  LABS: Results for orders placed or performed during the hospital encounter of 11/12/20 (from the past 24 hour(s))  POCT Rapid Strep A     Status: Abnormal   Collection Time: 11/12/20 10:08 AM  Result Value Ref Range   Streptococcus, Group A Screen (Direct) POSITIVE (A) NEGATIVE     ASSESSMENT &  PLAN:  1. Strep throat     Meds ordered this encounter  Medications   amoxicillin (AMOXIL) 500 MG capsule    Sig: Take 1 capsule (500 mg total) by mouth 2 (two) times daily for 10 days.    Dispense:  20 capsule    Refill:  0    Order Specific Question:   Supervising Provider    Answer:   Merrilee Jansky [id number]    Strep was positive.  Push fluids and get rest Prescribed amoxicillin 500mg  twice daily for 10 days.  Take as directed and to completion.  Drink warm or cool liquids, use throat lozenges, or popsicles to help alleviate symptoms Take  OTC ibuprofen or tylenol as needed for pain Follow up with PCP if symptoms persist Return or go to ER if you have any new or worsening symptoms such as fever, chills, nausea, vomiting, worsening sore throat, cough, abdominal pain, chest pain, changes in bowel or bladder habits  Reviewed expectations re: course of current medical issues. Questions answered. Outlined signs and symptoms indicating need for more acute intervention. Patient verbalized understanding. After Visit Summary given.           , NP 11/12/20 1057

## 2020-11-12 NOTE — Discharge Instructions (Signed)
Take the amoxicillin twice a day for the next 10 days.   You can take Tylenol and/or Ibuprofen as needed for fever reduction and pain relief.   For sore throat: try warm salt water gargles, cepacol lozenges, throat spray, warm tea or water with lemon/honey, popsicles or ice   It is important to stay hydrated: drink plenty of fluids (water, gatorade/powerade/pedialyte, juices, or teas) to keep your throat moisturized and help further relieve irritation/discomfort.   Return or go to the Emergency Department if symptoms worsen or do not improve in the next few days.  

## 2021-04-12 IMAGING — CR DG CHEST 2V
2 series · 2 of 2 positions shown · non-contrast
Comparison: None.

CLINICAL DATA: Shortness of breath

EXAM:
CHEST - 2 VIEW

[chest pa]
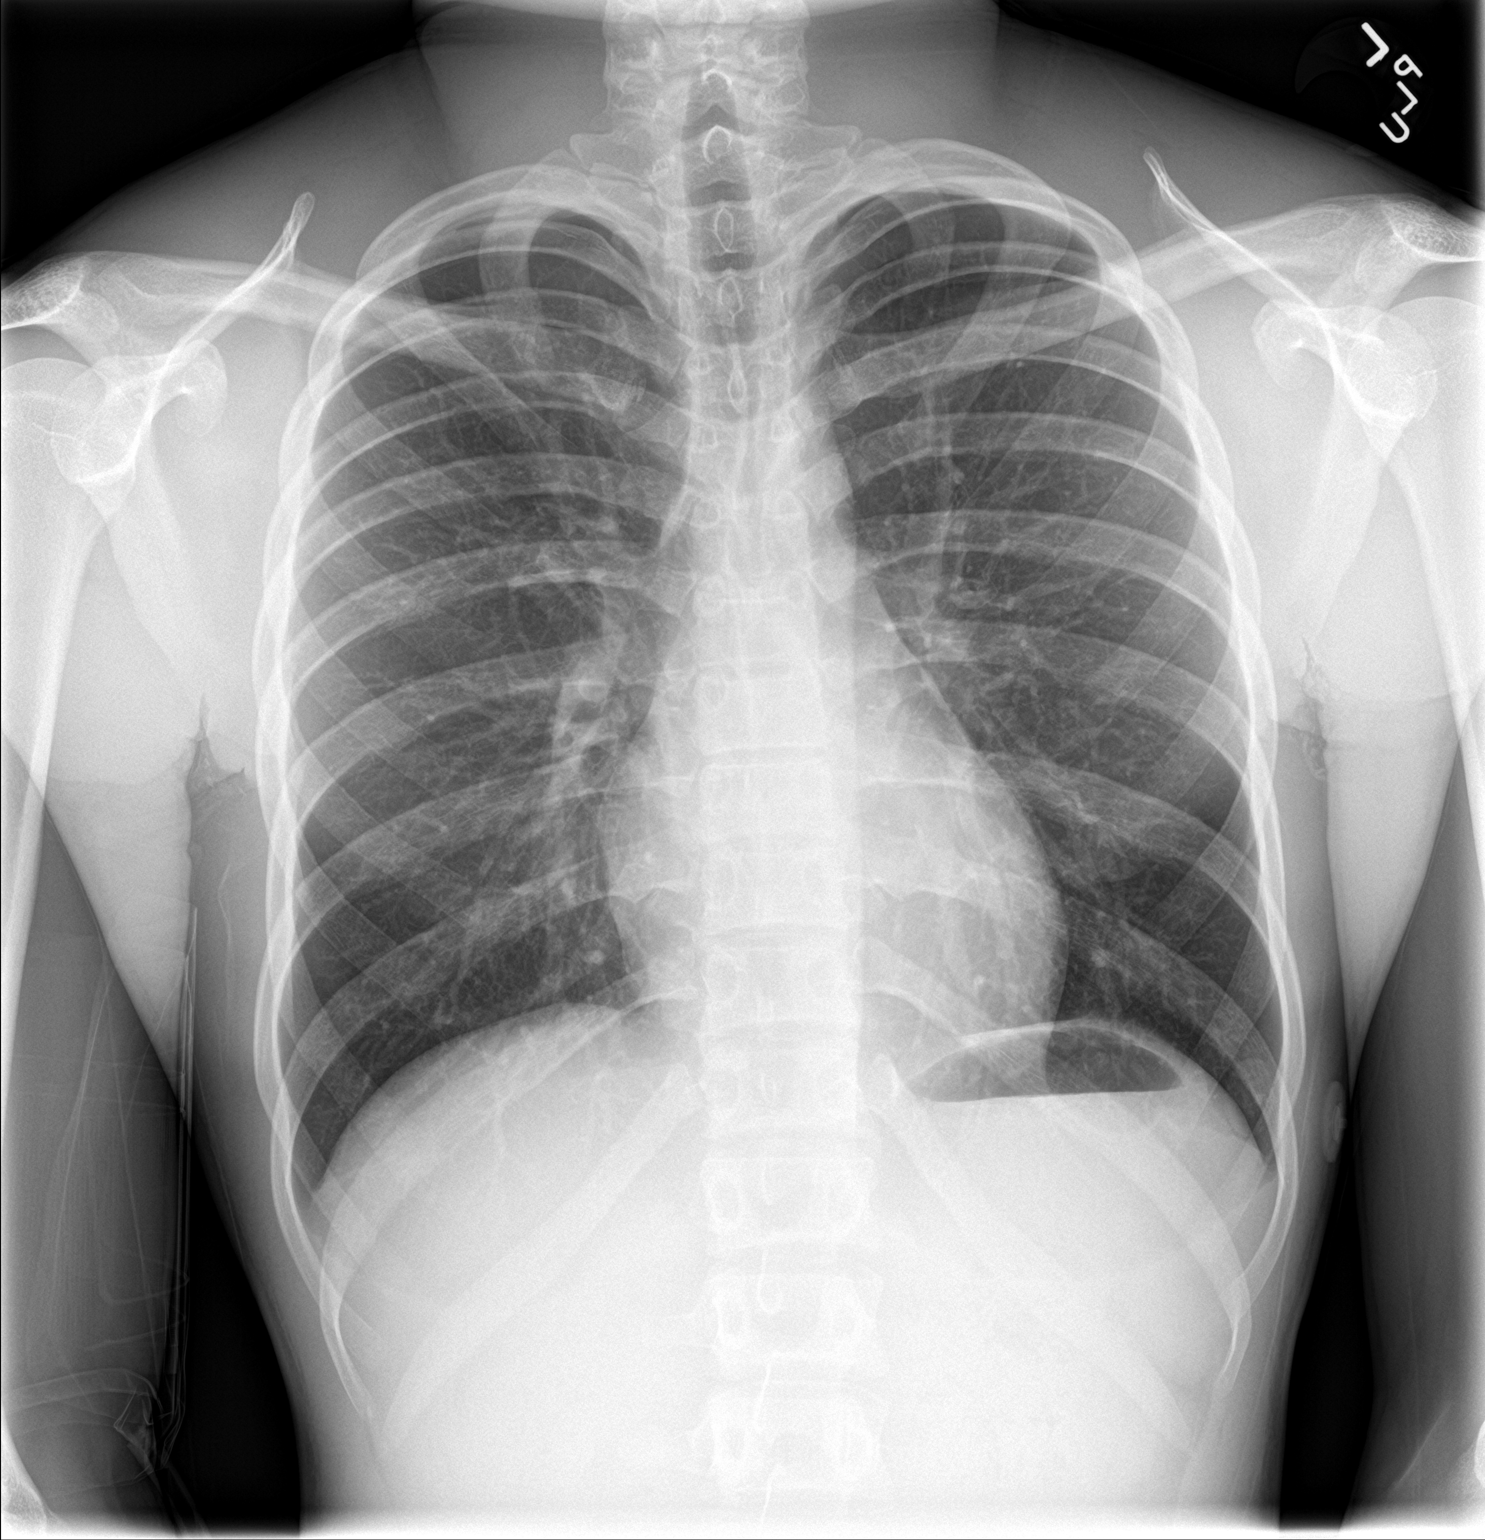

[chest lat]
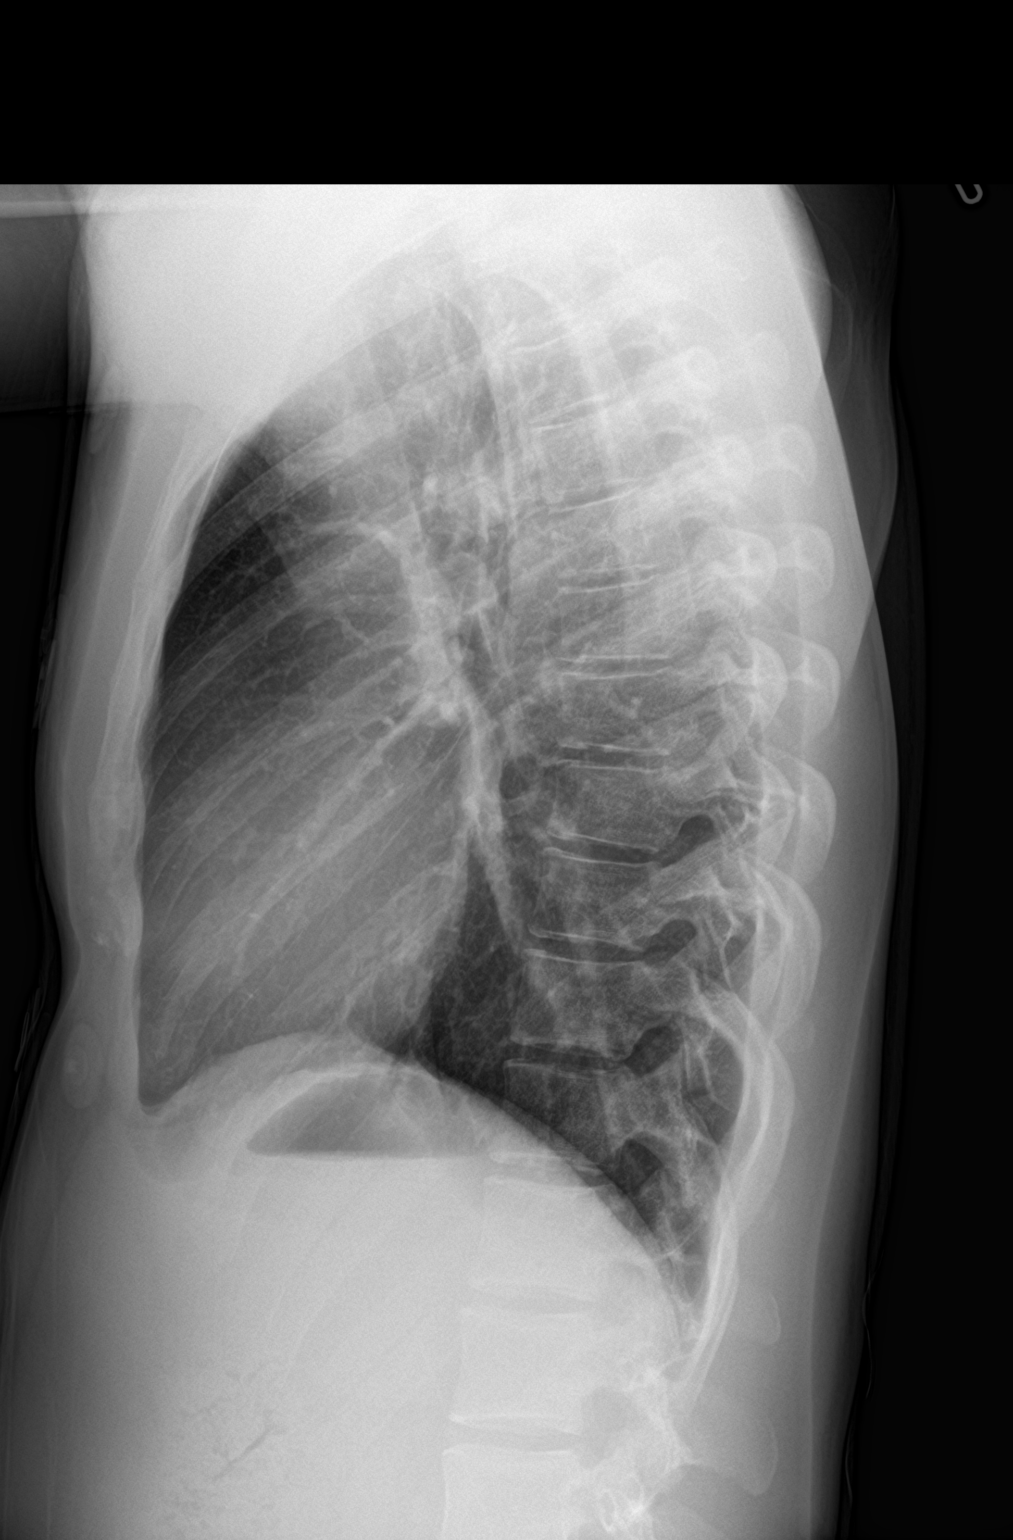

[2 of 2 positions shown; findings below may reference images not displayed]

FINDINGS: The heart size and mediastinal contours are within normal limits.
Both lungs are clear. The visualized skeletal structures are
unremarkable.
IMPRESSION: No active cardiopulmonary disease.

## 2021-11-23 ENCOUNTER — Encounter (HOSPITAL_COMMUNITY): Payer: Self-pay

## 2021-11-23 ENCOUNTER — Emergency Department (HOSPITAL_COMMUNITY): Payer: Medicaid Other

## 2021-11-23 ENCOUNTER — Other Ambulatory Visit: Payer: Self-pay

## 2021-11-23 ENCOUNTER — Emergency Department (HOSPITAL_COMMUNITY)
Admission: EM | Admit: 2021-11-23 | Discharge: 2021-11-24 | Disposition: A | Discharge status: Home / Self Care | Payer: Medicaid Other | Attending: Emergency Medicine | Admitting: Emergency Medicine

## 2021-11-23 DIAGNOSIS — N5089 Other specified disorders of the male genital organs: Secondary | ICD-10-CM | POA: Diagnosis present

## 2021-11-23 DIAGNOSIS — I861 Scrotal varices: Secondary | ICD-10-CM | POA: Insufficient documentation

## 2021-11-23 LAB — URINALYSIS, ROUTINE W REFLEX MICROSCOPIC
Bilirubin Urine: NEGATIVE
Glucose, UA: NEGATIVE mg/dL
Hgb urine dipstick: NEGATIVE
Ketones, ur: NEGATIVE mg/dL
Leukocytes,Ua: NEGATIVE
Nitrite: NEGATIVE
Protein, ur: NEGATIVE mg/dL
Specific Gravity, Urine: 1.016 (ref 1.005–1.030)
pH: 7 (ref 5.0–8.0)

## 2021-11-23 NOTE — ED Notes (Signed)
Refused Vitals 

## 2021-11-23 NOTE — ED Triage Notes (Signed)
Reports cool hot sensation to  bilateral testicle.  Denies burning with urination, itching or penile discharge.

## 2021-11-24 NOTE — ED Provider Notes (Signed)
[facility] Emergency Department Provider Note MRN:  [id number]  Arrival date & time: 11/24/21     Chief Complaint   Testicle Pain   History of Present Illness   David Andrade is a 21 y.o. year-old male presents to the ED with chief complaint of irritation of his testicle x6 days.  He denies any penile discharge, dysuria, or hematuria.  He denies any pain in the testicle.  Denies fevers or chills.  He is not concerned about STD.  He states that he had been using a new body wash that had dried out his skin, and has now switched back to [employer] body wash.  He states that the sensation on the testicle feels like a IcyHot cream sensation.  He states that he did notice a lot of dry skin on the testicle..     Review of Systems  Pertinent positive and negative review of systems noted in HPI.    Physical Exam   Vitals:   11/24/21 0110 11/24/21 0130  BP:  (!) 125/92  Pulse:  60  Resp:  20  Temp: 97.6 F (36.4 C)   SpO2:  97%    CONSTITUTIONAL:  well-appearing, NAD NEURO:  Alert and oriented x 3, CN 3-12 grossly intact EYES:  eyes equal and reactive ENT/NECK:  Supple, no stridor  CARDIO:   appears well-perfused  PULM:  No respiratory distress,  GI/GU:  non-distended, normal testicles and scrotum, no discharge, no pain MSK/SPINE:  No gross deformities, no edema, moves all extremities  SKIN:  no rash, atraumatic   *Additional and/or pertinent findings included in MDM below  Diagnostic and Interventional Summary    EKG Interpretation  Date/Time:    Ventricular Rate:    PR Interval:    QRS Duration:   QT Interval:    QTC Calculation:   R Axis:     Text Interpretation:         Labs Reviewed  URINALYSIS, ROUTINE W REFLEX MICROSCOPIC - Abnormal; Notable for the following components:      Result Value   APPearance CLOUDY (*)    All other components within normal limits  GC/CHLAMYDIA PROBE AMP ([place]) NOT AT [facility]    US SCROTUM W/DOPPLER   Final Result      Medications - No data to display   Procedures  /  Critical Care Procedures  ED Course and Medical Decision Making  I have reviewed the triage vital signs, the nursing notes, and pertinent available records from the EMR.  Social Determinants Affecting Complexity of Care: Patient has no clinically significant social determinants affecting this chief complaint..   ED Course:    Medical Decision Making Patient here with abnormal sensation to his scrotum x6 days.  No urinary symptoms.  No discharge.  No testicular pain.  Urinalysis is reassuring.  Scrotal ultrasound shows varicocele, but no signs of torsion or epididymitis.  Symptoms could be consistent with dry skin, but ultimately uncertain etiology.  Patient does not appear toxic.  I doubt that he needs further emergent work-up, surgery, or hospitalization at this time.  Feel that he can be safely discharged for outpatient follow-up.  Amount and/or Complexity of Data Reviewed Labs: ordered.     Consultants: No consultations were needed in caring for this patient.   Treatment and Plan: Emergency department workup does not suggest an emergent condition requiring admission or immediate intervention beyond  what has been performed at this time. The patient is safe for discharge and  has  been instructed to return immediately for worsening symptoms, change in  symptoms or any other concerns    Final Clinical Impressions(s) / ED Diagnoses     ICD-10-CM   1. Varicocele  I86.1     2. Scrotal irritation  N50.89       ED Discharge Orders     None         Discharge Instructions Discussed with and Provided to Patient:   Discharge Instructions   None      Roxy Horseman, PA-C 11/24/21 0155    Mesner, Barbara Cower, MD 11/24/21 [phone]

## 2022-04-14 ENCOUNTER — Encounter (HOSPITAL_COMMUNITY): Payer: Self-pay

## 2022-04-14 ENCOUNTER — Ambulatory Visit (HOSPITAL_COMMUNITY)
Admission: EM | Admit: 2022-04-14 | Discharge: 2022-04-14 | Disposition: A | Discharge status: Home / Self Care | Payer: Medicaid Other | Attending: Internal Medicine | Admitting: Internal Medicine

## 2022-04-14 DIAGNOSIS — Z202 Contact with and (suspected) exposure to infections with a predominantly sexual mode of transmission: Secondary | ICD-10-CM | POA: Diagnosis present

## 2022-04-14 LAB — HIV ANTIBODY (ROUTINE TESTING W REFLEX): HIV Screen 4th Generation wRfx: NONREACTIVE

## 2022-04-14 NOTE — ED Triage Notes (Signed)
Pt is here for STD-testing. Denies any symptoms at this time. 

## 2022-04-14 NOTE — Discharge Instructions (Signed)

## 2022-04-15 LAB — CYTOLOGY, (ORAL, ANAL, URETHRAL) ANCILLARY ONLY
Chlamydia: NEGATIVE
Comment: NEGATIVE
Comment: NEGATIVE
Comment: NORMAL
Neisseria Gonorrhea: NEGATIVE
Trichomonas: POSITIVE — AB

## 2022-04-15 LAB — RPR: RPR Ser Ql: NONREACTIVE

## 2022-04-15 NOTE — ED Provider Notes (Signed)
[place]    CSN: [id number] Arrival date & time: 04/14/22  1632      History   Chief Complaint Chief Complaint  Patient presents with   Exposure to STD    HPI David Andrade is a 22 y.o. male.   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 Patient denies history of genital herpes, but states a recent male partner discussed her history of HSV-2 with him after he participated in sexual intercourse with him. Patient states she did not have an active outbreak at the time of intercourse and he wore a condom. The condom did not break. He does not have any penile discharge, penile rash, penile itching, dysuria, urinary frequency, urinary urgency, or urinary hesitancy. No fever/chills or other known exposure to STD. Patient has been "googling" and is very concerned that he may have contracted genital herpes from recent sexual contact. He is without other concerns or complaints.    Exposure to STD    Past Medical History:  Diagnosis Date   Asthma    Environmental allergies    History of sprained ankle     There are no problems to display for this patient.   Past Surgical History:  Procedure Laterality Date   ADENOIDECTOMY         Home Medications    Prior to Admission medications   Medication Sig Start Date End Date Taking? Authorizing Provider  acetaminophen (TYLENOL) 325 MG tablet Take 2 tablets (650 mg total) by mouth every 6 (six) hours as needed for mild pain or moderate pain. Patient not taking: Reported on 07/20/2020 11/30/17   Jean Rosenthal, NP  ibuprofen (ADVIL,MOTRIN) 600 MG tablet Take 1 tablet (600 mg total) by mouth every 6 (six) hours as needed for mild pain or moderate pain. Patient not taking: Reported on 07/20/2020 11/30/17   Jean Rosenthal, NP    Family History Family History  Problem Relation Age of Onset   Healthy Mother    Healthy Father     Social  History Social History   Tobacco Use   Smoking status: Never   Smokeless tobacco: Never  Substance Use Topics   Alcohol use: No   Drug use: No     Allergies   Patient has no known allergies.   Review of Systems Review of Systems Per HPI  Physical Exam Triage Vital Signs ED Triage Vitals  Enc Vitals Group     BP 04/14/22 1707 131/81     Pulse Rate 04/14/22 1707 78     Resp 04/14/22 1707 16     Temp 04/14/22 1707 98.1 F (36.7 C)     Temp Source 04/14/22 1707 Oral     SpO2 04/14/22 1707 100 %     Weight --      Height --      Head Circumference --      Peak Flow --      Pain Score 04/14/22 1708 0     Pain Loc --      Pain Edu? --      Excl. in [place]? --    No data found.  Updated Vital Signs BP 131/81 (BP Location: Left Arm)   Pulse 78   Temp 98.1 F (36.7 C) (Oral)   Resp 16   SpO2 100%   Visual Acuity Right Eye Distance:   Left Eye Distance:   Bilateral Distance:    Right Eye  Near:   Left Eye Near:    Bilateral Near:     Physical Exam Vitals and nursing note reviewed.  Constitutional:      Appearance: He is not ill-appearing or toxic-appearing.  HENT:     Head: Normocephalic and atraumatic.     Right Ear: Hearing and external ear normal.     Left Ear: Hearing and external ear normal.     Nose: Nose normal.     Mouth/Throat:     Lips: Pink.     Mouth: Mucous membranes are moist.  Eyes:     General: Lids are normal. Vision grossly intact. Gaze aligned appropriately.     Extraocular Movements: Extraocular movements intact.     Conjunctiva/sclera: Conjunctivae normal.  Cardiovascular:     Rate and Rhythm: Normal rate and regular rhythm.     Heart sounds: Normal heart sounds, S1 normal and S2 normal.  Pulmonary:     Effort: Pulmonary effort is normal. No respiratory distress.     Breath sounds: Normal breath sounds and air entry.  Genitourinary:    Comments: Deferred. Musculoskeletal:     Cervical back: Neck supple.  Skin:    General: Skin  is warm and dry.     Capillary Refill: Capillary refill takes less than 2 seconds.     Findings: No rash.  Neurological:     General: No focal deficit present.     Mental Status: He is alert and oriented to person, place, and time. Mental status is at baseline.     Cranial Nerves: No dysarthria or facial asymmetry.  Psychiatric:        Mood and Affect: Mood normal.        Speech: Speech normal.        Behavior: Behavior normal.        Thought Content: Thought content normal.        Judgment: Judgment normal.      UC Treatments / Results  Labs (all labs ordered are listed, but only abnormal results are displayed) Labs Reviewed  HIV ANTIBODY (ROUTINE TESTING W REFLEX)  RPR  CYTOLOGY, (ORAL, ANAL, URETHRAL) ANCILLARY ONLY    EKG   Radiology No results found.  Procedures Procedures (including critical care time)  Medications Ordered in UC Medications - No data to display  Initial Impression / Assessment and Plan / UC Course  I have reviewed the triage vital signs and the nursing notes.  Pertinent labs & imaging results that were available during my care of the patient were reviewed by me and considered in my medical decision making (see chart for details).   1. Possible exposure to STD Discussed risk for transmission of HSV-2 at length with patient. Low suspicion for exposure to HSV-2 as patient wore a condom the entire time, did not participate in oral intercourse, and the partner was not having an outbreak at time of intercourse. Discussed signs of HSV-2 and when to come to the urgent care or PCP to be tested in case he develops symptoms, however this is very unlikely. He is currently asymptomatic and would like routine STD testing.   STI labs pending.  Patient would like HIV and syphilis testing today.  Will notify patient of positive results and treat accordingly when labs come back.  Patient to avoid sexual intercourse until screening testing comes back.  Education  provided regarding safe sexual practices and patient encouraged to use protection to prevent spread of STIs.   Discussed physical exam and available lab  work findings in clinic with patient.  Counseled patient regarding appropriate use of medications and potential side effects for all medications recommended or prescribed today. Discussed red flag signs and symptoms of worsening condition,when to call the PCP office, return to urgent care, and when to seek higher level of care in the emergency department. Patient verbalizes understanding and agreement with plan. All questions answered. Patient discharged in stable condition.    Final Clinical Impressions(s) / UC Diagnoses   Final diagnoses:  Possible exposure to STD     Discharge Instructions      Your STD testing has been sent to the lab and will come back in the next 2 to 3 days.  We will call you if any of your results are positive requiring treatment and treat you at that time.   If you do not receive a phone call from [place], this means your testing was negative.  Avoid sexual intercourse until your STD results come back.  If any of your STD results are positive, you will need to avoid sexual intercourse for 7 days while you are being treated to prevent spread of STD.  Condom use is the best way to prevent spread of STDs.  Return to urgent care as needed.     ED Prescriptions   None    PDMP not reviewed this encounter.   Talbot Grumbling, [place] 04/15/22 1246

## 2022-04-16 ENCOUNTER — Telehealth (HOSPITAL_COMMUNITY): Payer: Self-pay | Admitting: Emergency Medicine

## 2022-04-16 MED ORDER — METRONIDAZOLE 500 MG PO TABS: 2000.0000 mg | ORAL_TABLET | Freq: Once | ORAL | 0 refills | Status: AC

## 2023-04-07 ENCOUNTER — Ambulatory Visit (HOSPITAL_COMMUNITY)
Admission: EM | Admit: 2023-04-07 | Discharge: 2023-04-07 | Disposition: A | Discharge status: Home / Self Care | Payer: Medicaid Other | Attending: Family Medicine | Admitting: Family Medicine

## 2023-04-07 ENCOUNTER — Encounter (HOSPITAL_COMMUNITY): Payer: Self-pay | Admitting: *Deleted

## 2023-04-07 ENCOUNTER — Other Ambulatory Visit: Payer: Self-pay

## 2023-04-07 DIAGNOSIS — N4889 Other specified disorders of penis: Secondary | ICD-10-CM | POA: Diagnosis present

## 2023-04-07 NOTE — ED Triage Notes (Signed)
PT had unprotected sex. Pt now has penile irritation.

## 2023-04-07 NOTE — ED Provider Notes (Signed)
  [facility]   [id number] 04/07/23 Arrival Time: 1424  ASSESSMENT & PLAN:  1. Penile irritation    Pending: Labs Reviewed  CYTOLOGY, (ORAL, ANAL, URETHRAL) ANCILLARY ONLY   No empiric tx. Will notify of any positive results. Instructed to refrain from sexual activity for at least seven days.  Reviewed expectations re: course of current medical issues. Questions answered. Outlined signs and symptoms indicating need for more acute intervention. Patient verbalized understanding. After Visit Summary given.   SUBJECTIVE:  David Andrade is a 23 y.o. male who presents with complaint of penile "irritated feeling"; few days. One male sexual partner; unprotected intercourse. Denies penile discharge.  OBJECTIVE:  Vitals:   04/07/23 1520  BP: 123/84  Pulse: 71  Resp: 18  Temp: 97.9 F (36.6 C)  SpO2: 97%    General appearance: alert, cooperative, appears stated age and no distress GU: deferred Skin: warm and dry Psychological: alert and cooperative; normal mood and affect.    Labs Reviewed  CYTOLOGY, (ORAL, ANAL, URETHRAL) ANCILLARY ONLY    No Known Allergies  Past Medical History:  Diagnosis Date   Asthma    Environmental allergies    History of sprained ankle    Family History  Problem Relation Age of Onset   Healthy Mother    Healthy Father    Social History   Socioeconomic History   Marital status: Single    Spouse name: Not on file   Number of children: Not on file   Years of education: Not on file   Highest education level: Not on file  Occupational History   Not on file  Tobacco Use   Smoking status: Never   Smokeless tobacco: Never  Substance and Sexual Activity   Alcohol use: No   Drug use: No   Sexual activity: Not on file  Other Topics Concern   Not on file  Social History Narrative   Not on file   Social Drivers of Health   Financial Resource Strain: Not on file  Food Insecurity: Not on file  Transportation Needs:  Not on file  Physical Activity: Not on file  Stress: Not on file  Social Connections: Not on file  Intimate Partner Violence: Not on file           Mardella Layman, MD 04/07/23 [phone]

## 2023-04-09 ENCOUNTER — Telehealth (HOSPITAL_BASED_OUTPATIENT_CLINIC_OR_DEPARTMENT_OTHER): Payer: Self-pay

## 2023-04-09 ENCOUNTER — Telehealth (HOSPITAL_COMMUNITY): Payer: Self-pay | Admitting: *Deleted

## 2023-04-09 LAB — CYTOLOGY, (ORAL, ANAL, URETHRAL) ANCILLARY ONLY
Chlamydia: POSITIVE — AB
Comment: NEGATIVE
Comment: NEGATIVE
Comment: NORMAL
Neisseria Gonorrhea: NEGATIVE
Trichomonas: NEGATIVE

## 2023-04-09 MED ORDER — DOXYCYCLINE HYCLATE 100 MG PO TABS: 100.0000 mg | ORAL_TABLET | Freq: Two times a day (BID) | ORAL | 0 refills | Status: AC

## 2023-04-09 MED ORDER — DOXYCYCLINE HYCLATE 100 MG PO CAPS: 100.0000 mg | ORAL_CAPSULE | Freq: Two times a day (BID) | ORAL | 0 refills | Status: AC

## 2023-04-09 NOTE — Telephone Encounter (Signed)
Per protocol, pt requires tx with Doxycycline.  Reviewed with patient, verified pharmacy, prescription sent.

## 2023-04-09 NOTE — Telephone Encounter (Signed)
Patient tested positive for chlamydia on April 07, 2023.  Patient will be treated with a 7-day course of doxycycline 100 mg twice daily.  Patient to be educated to abstain from sexual intercourse for the next 14 days.  If symptoms persist, patient should be retested in 4 weeks.  Clinical staff to notify patient of results, treatment recommendations and return precautions.

## 2023-04-09 NOTE — Telephone Encounter (Signed)
Pt seen cyto results on mychart and would like rx sent to [facility] [address].

## 2023-04-09 NOTE — Telephone Encounter (Signed)
Called pt to advise rx. Pt advised
